# Patient Record
Sex: Male | Born: 1955 | Race: White | Hispanic: No | Smoking: Never smoker
Health system: Southern US, Community
[De-identification: ages and names within clinical notes are randomized; demographics above are authoritative.]

## PROBLEM LIST (undated history)

## (undated) DIAGNOSIS — I10 Essential (primary) hypertension: Secondary | ICD-10-CM

## (undated) DIAGNOSIS — D759 Disease of blood and blood-forming organs, unspecified: Secondary | ICD-10-CM

## (undated) DIAGNOSIS — E119 Type 2 diabetes mellitus without complications: Secondary | ICD-10-CM

---

## 2020-08-31 ENCOUNTER — Encounter (HOSPITAL_BASED_OUTPATIENT_CLINIC_OR_DEPARTMENT_OTHER): Payer: Self-pay

## 2020-08-31 ENCOUNTER — Emergency Department (HOSPITAL_BASED_OUTPATIENT_CLINIC_OR_DEPARTMENT_OTHER)

## 2020-08-31 ENCOUNTER — Other Ambulatory Visit: Payer: Self-pay

## 2020-08-31 ENCOUNTER — Emergency Department (HOSPITAL_BASED_OUTPATIENT_CLINIC_OR_DEPARTMENT_OTHER)
Admission: EM | Admit: 2020-08-31 | Discharge: 2020-08-31 | Disposition: A | Discharge status: Home / Self Care | Attending: Emergency Medicine | Admitting: Emergency Medicine

## 2020-08-31 DIAGNOSIS — R42 Dizziness and giddiness: Secondary | ICD-10-CM | POA: Insufficient documentation

## 2020-08-31 DIAGNOSIS — E119 Type 2 diabetes mellitus without complications: Secondary | ICD-10-CM | POA: Diagnosis not present

## 2020-08-31 DIAGNOSIS — I10 Essential (primary) hypertension: Secondary | ICD-10-CM | POA: Diagnosis not present

## 2020-08-31 DIAGNOSIS — J329 Chronic sinusitis, unspecified: Secondary | ICD-10-CM | POA: Diagnosis not present

## 2020-08-31 HISTORY — DX: Type 2 diabetes mellitus without complications: E11.9

## 2020-08-31 HISTORY — DX: Essential (primary) hypertension: I10

## 2020-08-31 LAB — COMPREHENSIVE METABOLIC PANEL
ALT: 18 U/L (ref 0–44)
AST: 22 U/L (ref 15–41)
Albumin: 4.5 g/dL (ref 3.5–5.0)
Alkaline Phosphatase: 98 U/L (ref 38–126)
Anion gap: 9 (ref 5–15)
BUN: 16 mg/dL (ref 8–23)
CO2: 25 mmol/L (ref 22–32)
Calcium: 10 mg/dL (ref 8.9–10.3)
Chloride: 101 mmol/L (ref 98–111)
Creatinine, Ser: 0.9 mg/dL (ref 0.61–1.24)
GFR, Estimated: >60 mL/min (ref >60)
Glucose, Bld: 131 mg/dL — ABNORMAL HIGH (ref 70–99)
Potassium: 3.7 mmol/L (ref 3.5–5.1)
Sodium: 135 mmol/L (ref 135–145)
Total Bilirubin: 1.5 mg/dL — ABNORMAL HIGH (ref 0.3–1.2)
Total Protein: 6.8 g/dL (ref 6.5–8.1)

## 2020-08-31 LAB — CBC WITH DIFFERENTIAL/PLATELET
Abs Immature Granulocytes: 0.04 10*3/uL (ref 0.00–0.07)
Basophils Absolute: 0.1 10*3/uL (ref 0.0–0.1)
Basophils Relative: 1 %
Eosinophils Absolute: 0.2 10*3/uL (ref 0.0–0.5)
Eosinophils Relative: 2 %
HCT: 53.6 % — ABNORMAL HIGH (ref 39.0–52.0)
Hemoglobin: 18.2 g/dL — ABNORMAL HIGH (ref 13.0–17.0)
Immature Granulocytes: 0 %
Lymphocytes Relative: 4 %
Lymphs Abs: 0.4 10*3/uL — ABNORMAL LOW (ref 0.7–4.0)
MCH: 28.9 pg (ref 26.0–34.0)
MCHC: 34 g/dL (ref 30.0–36.0)
MCV: 85.1 fL (ref 80.0–100.0)
Monocytes Absolute: 0.5 10*3/uL (ref 0.1–1.0)
Monocytes Relative: 4 %
Neutro Abs: 9.1 10*3/uL — ABNORMAL HIGH (ref 1.7–7.7)
Neutrophils Relative %: 89 %
Platelets: 302 10*3/uL (ref 150–400)
RBC: 6.3 MIL/uL — ABNORMAL HIGH (ref 4.22–5.81)
RDW: 14.4 % (ref 11.5–15.5)
WBC: 10.4 10*3/uL (ref 4.0–10.5)
nRBC: 0 % (ref 0.0–0.2)

## 2020-08-31 LAB — TROPONIN I (HIGH SENSITIVITY): Troponin I (High Sensitivity): 4 ng/L (ref <18)

## 2020-08-31 MED ORDER — MECLIZINE HCL 25 MG PO TABS: 25.0000 mg | ORAL_TABLET | Freq: Three times a day (TID) | ORAL | 0 refills | Status: AC | PRN

## 2020-08-31 MED ORDER — AMOXICILLIN-POT CLAVULANATE 875-125 MG PO TABS: 1.0000 | ORAL_TABLET | Freq: Two times a day (BID) | ORAL | 0 refills | Status: AC

## 2020-08-31 NOTE — ED Provider Notes (Signed)
[facility] EMERGENCY DEPT Provider Note   CSN: [id number] Arrival date & time: 08/31/20  1703     History Chief Complaint  Patient presents with  . Dizziness    Phillip Davidson is a 65 y.o. male.  Presents to ER with concern for dizziness.  States that he has recently developed some increased sinus congestion having a lot of sinus pain in his face.  States that he frequently gets sinus infections around this time a year and these often trigger vertigo episodes.  Reports vertigo he has experienced today is similar to prior.  No generalized headache at present, no neck pain or neck stiffness.  Denies speech change, vision change, numbness or weakness.  No difficulty walking.  Dizziness worse with sudden movements, improved with rest. HPI     Past Medical History:  Diagnosis Date  . Diabetes mellitus without complication (HCC)   . Hypertension     There are no problems to display for this patient.    No family history on file.  Social History   Tobacco Use  . Smoking status: Never Smoker  . Smokeless tobacco: Never Used    Home Medications Prior to Admission medications   Medication Sig Start Date End Date Taking? Authorizing Provider  amoxicillin-clavulanate (AUGMENTIN) 875-125 MG tablet Take 1 tablet by mouth every 12 (twelve) hours for 7 days. 08/31/20 09/07/20 Yes Milagros Loll, MD  meclizine (ANTIVERT) 25 MG tablet Take 1 tablet (25 mg total) by mouth 3 (three) times daily as needed for dizziness. 08/31/20  Yes Milagros Loll, MD    Allergies    Ace inhibitors and Lisinopril  Review of Systems   Review of Systems  Constitutional: Negative for chills and fever.  HENT: Positive for sinus pressure and sinus pain. Negative for ear pain and sore throat.   Eyes: Negative for pain and visual disturbance.  Respiratory: Negative for cough and shortness of breath.   Cardiovascular: Negative for chest pain and palpitations.  Gastrointestinal: Negative  for abdominal pain and vomiting.  Genitourinary: Negative for dysuria and hematuria.  Musculoskeletal: Negative for arthralgias and back pain.  Skin: Negative for color change and rash.  Neurological: Positive for dizziness. Negative for seizures and syncope.  All other systems reviewed and are negative.   Physical Exam Updated Vital Signs BP (!) 176/105 (BP Location: Right Arm)   Pulse 67   Temp 98.7 F (37.1 C) (Oral)   Resp 18   SpO2 96%   Physical Exam Vitals and nursing note reviewed.  Constitutional:      Appearance: He is well-developed.  HENT:     Head: Normocephalic and atraumatic.     Comments: Some tenderness to palpation over maxillary sinuses    Right Ear: Tympanic membrane, ear canal and external ear normal.     Left Ear: Tympanic membrane, ear canal and external ear normal.     Nose: Congestion present.  Eyes:     Conjunctiva/sclera: Conjunctivae normal.  Cardiovascular:     Rate and Rhythm: Normal rate and regular rhythm.     Heart sounds: No murmur heard.   Pulmonary:     Effort: Pulmonary effort is normal. No respiratory distress.     Breath sounds: Normal breath sounds.  Abdominal:     Palpations: Abdomen is soft.     Tenderness: There is no abdominal tenderness.  Musculoskeletal:     Cervical back: Neck supple.  Skin:    General: Skin is warm and dry.  Neurological:  Mental Status: He is alert.     Comments: AAOx3 CN 2-12 intact, speech clear visual fields intact 5/5 strength in b/l UE and LE Sensation to light touch intact in b/l UE and LE Normal FNF Normal gait     ED Results / Procedures / Treatments   Labs (all labs ordered are listed, but only abnormal results are displayed) Labs Reviewed  CBC WITH DIFFERENTIAL/PLATELET - Abnormal; Notable for the following components:      Result Value   RBC 6.30 (*)    Hemoglobin 18.2 (*)    HCT 53.6 (*)    Neutro Abs 9.1 (*)    Lymphs Abs 0.4 (*)    All other components within normal  limits  COMPREHENSIVE METABOLIC PANEL - Abnormal; Notable for the following components:   Glucose, Bld 131 (*)    Total Bilirubin 1.5 (*)    All other components within normal limits  TROPONIN I (HIGH SENSITIVITY)    EKG EKG Interpretation  Date/Time:  Sunday Aug 31 2020 18:03:45 EDT Ventricular Rate:  63 PR Interval:  152 QRS Duration: 90 QT Interval:  384 QTC Calculation: 392 R Axis:   72 Text Interpretation: Normal sinus rhythm Normal ECG Confirmed by Nakiya Rallis ([id number]) on 08/31/2020 7:34:33 PM   Radiology CT Head Wo Contrast  Result Date: 08/31/2020 CLINICAL DATA:  Dizziness EXAM: CT HEAD WITHOUT CONTRAST TECHNIQUE: Contiguous axial images were obtained from the base of the skull through the vertex without intravenous contrast. COMPARISON:  None. FINDINGS: Brain: No evidence of acute infarction, hemorrhage, hydrocephalus, extra-axial collection or mass lesion/mass effect. Vascular: No hyperdense vessel or unexpected calcification. Skull: Normal. Negative for fracture or focal lesion. Sinuses/Orbits: No acute finding. Other: None. IMPRESSION: No acute intracranial abnormality noted. Electronically Signed   By: Mark  Lukens M.D.   On: 08/31/2020 19:24    Procedures Procedures   Medications Ordered in ED Medications - No data to display  ED Course  I have reviewed the triage vital signs and the nursing notes.  Pertinent labs & imaging results that were available during my care of the patient were reviewed by me and considered in my medical decision making (see chart for details).    MDM Rules/Calculators/A&P                         64  year old male presents to ER with concern for sinus congestion/pain as well as vertigo.  On exam patient well-appearing in no distress.  CT head negative, basic labs within normal limits.  On exam notably has a neuro neurologic exam that is normal, no difficulty walking, noted to have some tenderness over his maxillary sinuses but  otherwise benign HEENT exam.  Suspect sinusitis or other upper respiratory illness triggering vertigo.  Given positional nature, suspect BPPV.  Given his reassuring exam and patient's report that he has had prior similar episodes, do not feel more advanced imaging needed at present.  Recommend trial of antibiotics and follow-up with primary doctor.  After the discussed management above, the patient was determined to be safe for discharge.  The patient was in agreement with this plan and all questions regarding their care were answered.  ED return precautions were discussed and the patient will return to the ED with any significant worsening of condition.   Final Clinical Impression(s) / ED Diagnoses Final diagnoses:  Sinusitis, unspecified chronicity, unspecified location  Vertigo    Rx / DC Orders ED Discharge Orders  Ordered    amoxicillin-clavulanate (AUGMENTIN) 875-125 MG tablet  Every 12 hours        08/31/20 1940    meclizine (ANTIVERT) 25 MG tablet  3 times daily PRN        08/31/20 1940           Milagros Loll, MD 09/01/20 1652

## 2020-08-31 NOTE — Discharge Instructions (Addendum)
Follow-up with your primary care doctor.  Return to ER if you feel like your dizziness is worsening, you develop numbness, speech changes, vision changes, weakness, difficulty walking or other new concerning symptom.  Take antibiotic as prescribed for your sinus infection.

## 2020-08-31 NOTE — ED Notes (Signed)
This RN presented the AVS utilizing Teachback Method. Patient verbalizes understanding of Discharge Instructions. Opportunity for Questioning and Answers were provided. Patient Discharged from ED ambulatory to home with Significant Other.

## 2020-08-31 NOTE — ED Notes (Signed)
Repeat Troponin not needed for discharge per MD Dykstra.

## 2020-08-31 NOTE — ED Triage Notes (Signed)
He co dizziness (s;inning-type) which began this morning. He was seen at a [facility] urgent care today and they advised him to come to hospital to be checked. He is awake, alert and in no distress.

## 2022-06-18 ENCOUNTER — Encounter (HOSPITAL_BASED_OUTPATIENT_CLINIC_OR_DEPARTMENT_OTHER): Payer: Self-pay | Admitting: Emergency Medicine

## 2022-06-18 ENCOUNTER — Other Ambulatory Visit: Payer: Self-pay

## 2022-06-18 ENCOUNTER — Emergency Department (HOSPITAL_BASED_OUTPATIENT_CLINIC_OR_DEPARTMENT_OTHER)
Admission: EM | Admit: 2022-06-18 | Discharge: 2022-06-18 | Disposition: A | Discharge status: Home / Self Care | Payer: Medicare Other | Attending: Emergency Medicine | Admitting: Emergency Medicine

## 2022-06-18 DIAGNOSIS — E1165 Type 2 diabetes mellitus with hyperglycemia: Secondary | ICD-10-CM | POA: Insufficient documentation

## 2022-06-18 DIAGNOSIS — R739 Hyperglycemia, unspecified: Secondary | ICD-10-CM

## 2022-06-18 HISTORY — DX: Disease of blood and blood-forming organs, unspecified: D75.9

## 2022-06-18 LAB — CBG MONITORING, ED: Glucose-Capillary: 328 mg/dL — ABNORMAL HIGH (ref 70–99)

## 2022-06-18 LAB — CBC WITH DIFFERENTIAL/PLATELET
Abs Immature Granulocytes: 0.03 10*3/uL (ref 0.00–0.07)
Basophils Absolute: 0.1 10*3/uL (ref 0.0–0.1)
Basophils Relative: 1 %
Eosinophils Absolute: 0.2 10*3/uL (ref 0.0–0.5)
Eosinophils Relative: 2 %
HCT: 43.8 % (ref 39.0–52.0)
Hemoglobin: 15.3 g/dL (ref 13.0–17.0)
Immature Granulocytes: 0 %
Lymphocytes Relative: 24 %
Lymphs Abs: 2.1 10*3/uL (ref 0.7–4.0)
MCH: 31.1 pg (ref 26.0–34.0)
MCHC: 34.9 g/dL (ref 30.0–36.0)
MCV: 89 fL (ref 80.0–100.0)
Monocytes Absolute: 0.4 10*3/uL (ref 0.1–1.0)
Monocytes Relative: 5 %
Neutro Abs: 5.8 10*3/uL (ref 1.7–7.7)
Neutrophils Relative %: 68 %
Platelets: 192 10*3/uL (ref 150–400)
RBC: 4.92 MIL/uL (ref 4.22–5.81)
RDW: 13.9 % (ref 11.5–15.5)
WBC: 8.6 10*3/uL (ref 4.0–10.5)
nRBC: 0 % (ref 0.0–0.2)

## 2022-06-18 LAB — I-STAT VENOUS BLOOD GAS, ED
Acid-Base Excess: 0 mmol/L (ref 0.0–2.0)
Bicarbonate: 25.6 mmol/L (ref 20.0–28.0)
Calcium, Ion: 1.34 mmol/L (ref 1.15–1.40)
HCT: 46 % (ref 39.0–52.0)
Hemoglobin: 15.6 g/dL (ref 13.0–17.0)
O2 Saturation: 70 %
Patient temperature: 98.3
Potassium: 3.6 mmol/L (ref 3.5–5.1)
Sodium: 133 mmol/L — ABNORMAL LOW (ref 135–145)
TCO2: 27 mmol/L (ref 22–32)
pCO2, Ven: 43 mmHg — ABNORMAL LOW (ref 44–60)
pH, Ven: 7.382 (ref 7.25–7.43)
pO2, Ven: 37 mmHg (ref 32–45)

## 2022-06-18 LAB — COMPREHENSIVE METABOLIC PANEL
ALT: 21 U/L (ref 0–44)
AST: 20 U/L (ref 15–41)
Albumin: 4.2 g/dL (ref 3.5–5.0)
Alkaline Phosphatase: 104 U/L (ref 38–126)
Anion gap: 9 (ref 5–15)
BUN: 24 mg/dL — ABNORMAL HIGH (ref 8–23)
CO2: 27 mmol/L (ref 22–32)
Calcium: 10.2 mg/dL (ref 8.9–10.3)
Chloride: 95 mmol/L — ABNORMAL LOW (ref 98–111)
Creatinine, Ser: 1.19 mg/dL (ref 0.61–1.24)
GFR, Estimated: >60 mL/min (ref >60)
Glucose, Bld: 325 mg/dL — ABNORMAL HIGH (ref 70–99)
Potassium: 3.6 mmol/L (ref 3.5–5.1)
Sodium: 131 mmol/L — ABNORMAL LOW (ref 135–145)
Total Bilirubin: 1 mg/dL (ref 0.3–1.2)
Total Protein: 7 g/dL (ref 6.5–8.1)

## 2022-06-18 MED ORDER — SODIUM CHLORIDE 0.9 % IV BOLUS
1000.0000 mL | Freq: Once | INTRAVENOUS | Status: AC
  Administered 2022-06-18: 1000 mL via INTRAVENOUS

## 2022-06-18 NOTE — ED Provider Notes (Signed)
[facility] Provider Note   CSN: [id number] Arrival date & time: 06/18/22  1735     History  Chief Complaint  Patient presents with   Hyperglycemia    Phillip Davidson is a 67 y.o. male.  Patient sent for evaluation by primary care doctor as blood sugar was elevated.  Prescription was sent in for metformin but they told him to come to get evaluated.  Denies any chest pain or shortness of breath.  Denies any major urinary frequency or increased thirst or dizziness or weakness.  Denies any chest pain or shortness of breath or fever or chills.  The history is provided by the patient.       Home Medications Prior to Admission medications   Medication Sig Start Date End Date Taking? Authorizing Provider  meclizine (ANTIVERT) 25 MG tablet Take 1 tablet (25 mg total) by mouth 3 (three) times daily as needed for dizziness. 08/31/20   Lucrezia Starch, MD      Allergies    Ace inhibitors and Lisinopril    Review of Systems   Review of Systems  Physical Exam Updated Vital Signs BP (!) 158/91 (BP Location: Right Arm)   Pulse (!) 59   Temp 97.9 F (36.6 C)   Resp 16   SpO2 98%  Physical Exam Vitals and nursing note reviewed.  Constitutional:      General: He is not in acute distress.    Appearance: He is well-developed. He is not ill-appearing.  HENT:     Head: Normocephalic and atraumatic.     Nose: Nose normal.     Mouth/Throat:     Mouth: Mucous membranes are moist.  Eyes:     Extraocular Movements: Extraocular movements intact.     Conjunctiva/sclera: Conjunctivae normal.     Pupils: Pupils are equal, round, and reactive to light.  Cardiovascular:     Rate and Rhythm: Normal rate and regular rhythm.     Pulses: Normal pulses.     Heart sounds: Normal heart sounds. No murmur heard. Pulmonary:     Effort: Pulmonary effort is normal. No respiratory distress.     Breath sounds: Normal breath sounds.  Abdominal:      Palpations: Abdomen is soft.     Tenderness: There is no abdominal tenderness.  Musculoskeletal:        General: No swelling.     Cervical back: Normal range of motion and neck supple.  Skin:    General: Skin is warm and dry.     Capillary Refill: Capillary refill takes less than 2 seconds.  Neurological:     General: No focal deficit present.     Mental Status: He is alert.  Psychiatric:        Mood and Affect: Mood normal.     ED Results / Procedures / Treatments   Labs (all labs ordered are listed, but only abnormal results are displayed) Labs Reviewed  COMPREHENSIVE METABOLIC PANEL - Abnormal; Notable for the following components:      Result Value   Sodium 131 (*)    Chloride 95 (*)    Glucose, Bld 325 (*)    BUN 24 (*)    All other components within normal limits  CBG MONITORING, ED - Abnormal; Notable for the following components:   Glucose-Capillary 328 (*)    All other components within normal limits  I-STAT VENOUS BLOOD GAS, ED - Abnormal; Notable for the following components:   pCO2,  Ven 43.0 (*)    Sodium 133 (*)    All other components within normal limits  CBC WITH DIFFERENTIAL/PLATELET  BLOOD GAS, VENOUS    EKG None  Radiology No results found.  Procedures Procedures    Medications Ordered in ED Medications  sodium chloride 0.9 % bolus 1,000 mL (1,000 mLs Intravenous New Bag/Given 06/18/22 1822)    ED Course/ Medical Decision Making/ A&P                             Medical Decision Making Amount and/or Complexity of Data Reviewed Labs: ordered.   [name] is here with high blood sugar.  Was diagnosed with diabetes and sent for evaluation to make sure he was not in DKA.  He has been started on metformin but has not started it yet.  Primary care doctor sent him.  Blood sugars 328 here.  Very well-appearing.  Normal vitals.  No fever.  Will evaluate for DKA with CBC, CMP, blood gas.  Will give fluid bolus.  pH is normal.  Bicarb is  normal.  Have no concern for DKA.  IV fluid bolus to be given.  Anticipate discharge and have him start metformin.  I educated him about diet and exercise.  Awaiting CBC and CMP.  Remaining lab work per my review and interpretation is unremarkable.  Patient's not in DKA.  IV fluids given.  Recommend starting metformin and close follow-up with primary care doctor.  This chart was dictated using voice recognition software.  Despite best efforts to proofread,  errors can occur which can change the documentation meaning.         Final Clinical Impression(s) / ED Diagnoses Final diagnoses:  Hyperglycemia    Rx / DC Orders ED Discharge Orders     None         Lennice Sites, DO 06/18/22 1856

## 2022-06-18 NOTE — ED Notes (Signed)
Discharge instructions, medications and follow up care reviewed and explained. Pt verbalized understanding and had no further questions.   

## 2022-06-18 NOTE — ED Triage Notes (Signed)
Checked glucose at home. Home reading 377.  Seen by PCP today and started on metformin.  Denies symptoms

## 2023-01-05 IMAGING — CT CT HEAD W/O CM
4 series · 17 of 47 positions shown, 19 images · non-contrast
Comparison: None.

CLINICAL DATA: Dizziness

EXAM:
CT HEAD WITHOUT CONTRAST
TECHNIQUE: Contiguous axial images were obtained from the base of the skull
through the vertex without intravenous contrast.

[Series 2: head wo · axial · 0.45mm/px · z∈[-274,-154]mm · 7 of 32 slices shown, 9 images]
[im 4/32  brain]
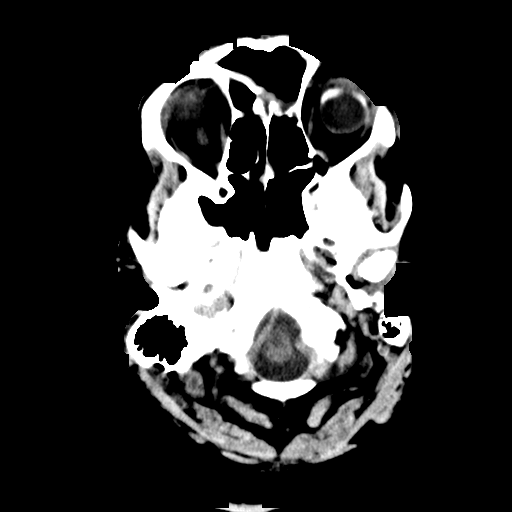
[im 4/32  bone]
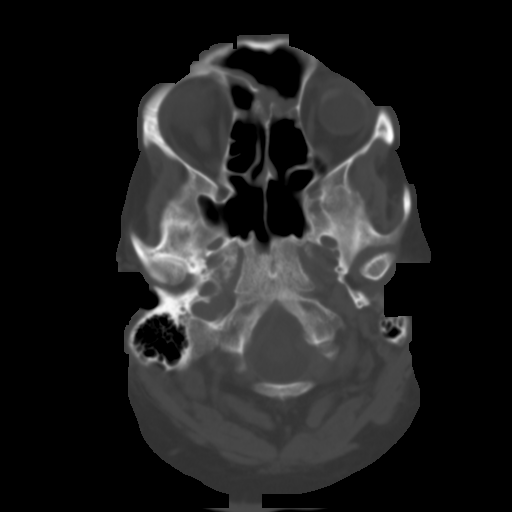
[im 8/32  brain]
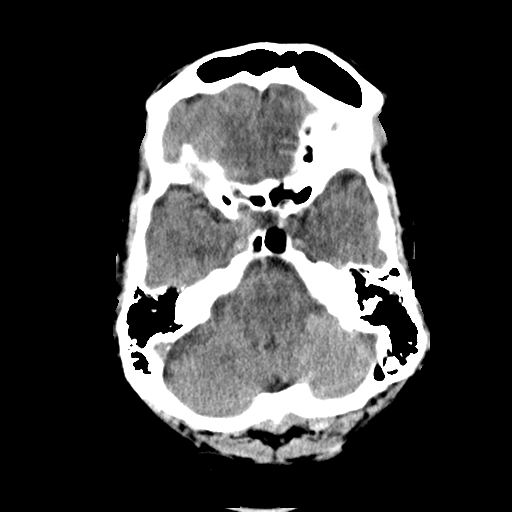
[im 12/32  brain]
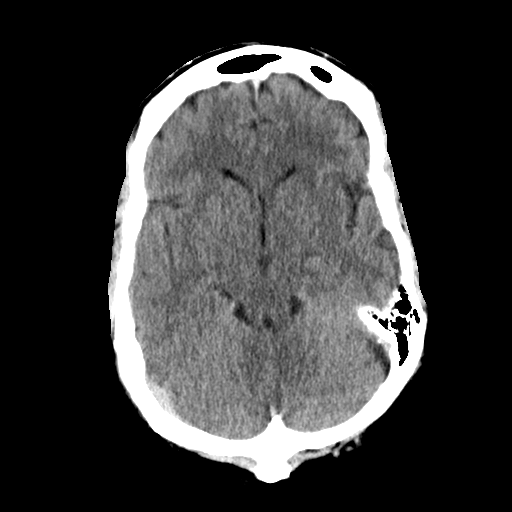
[im 16/32  brain]
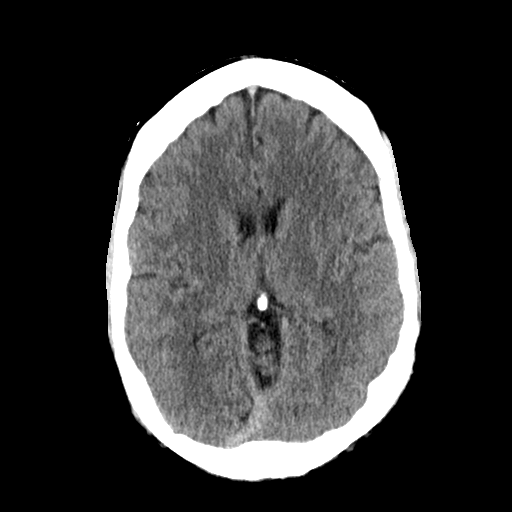
[im 20/32  brain]
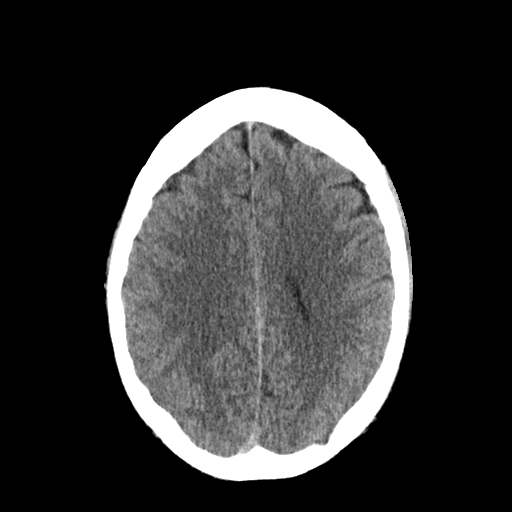
[im 20/32  bone]
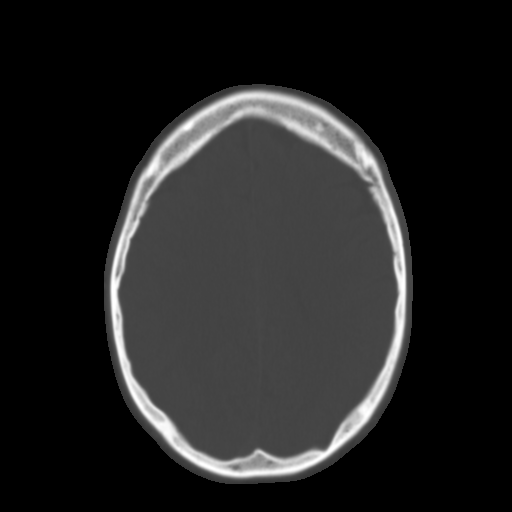
[im 24/32  brain]
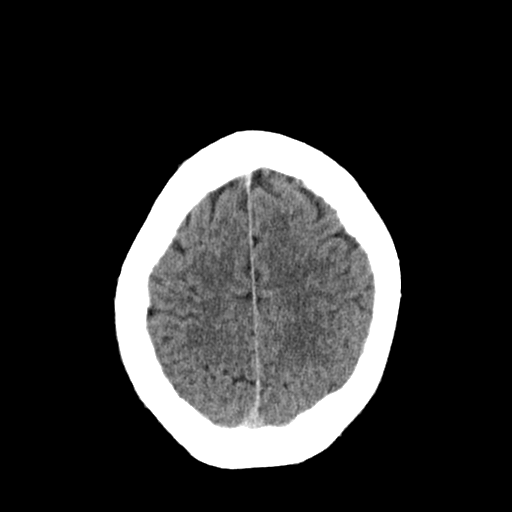
[im 28/32  brain]
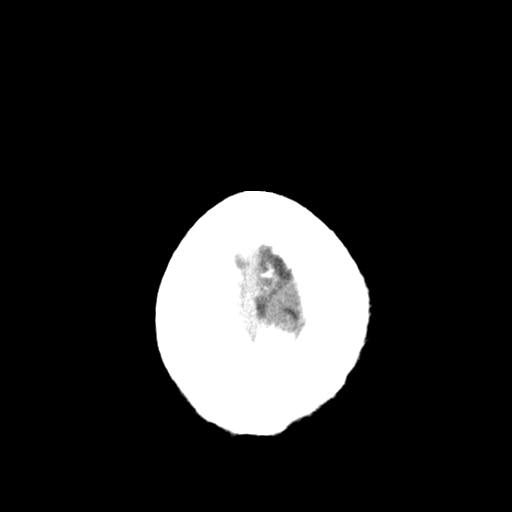

[Series 3: head bone · axial · 0.45mm/px · z∈[-275,-219]mm · 4 of 79 slices shown]
[im 8/79  bone]
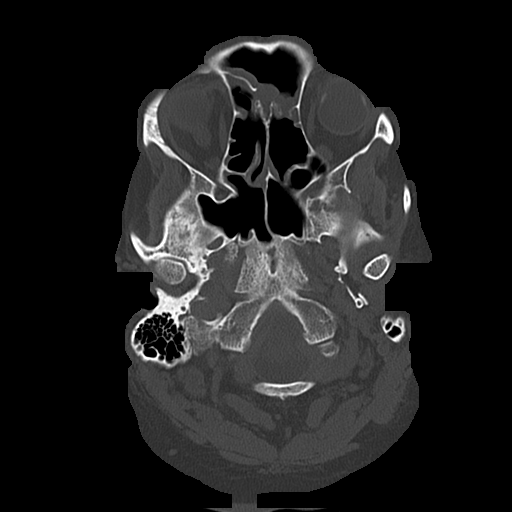
[im 16/79  bone]
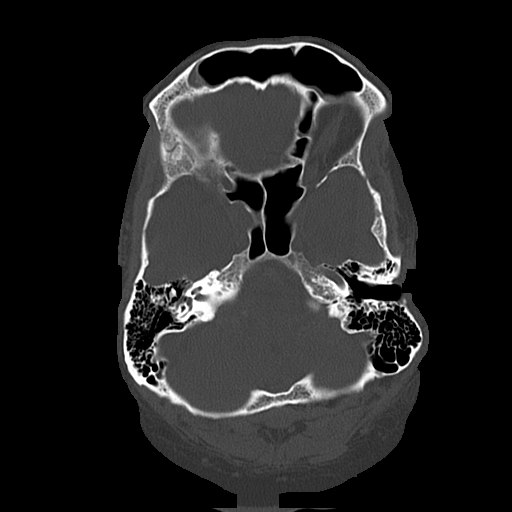
[im 24/79  bone]
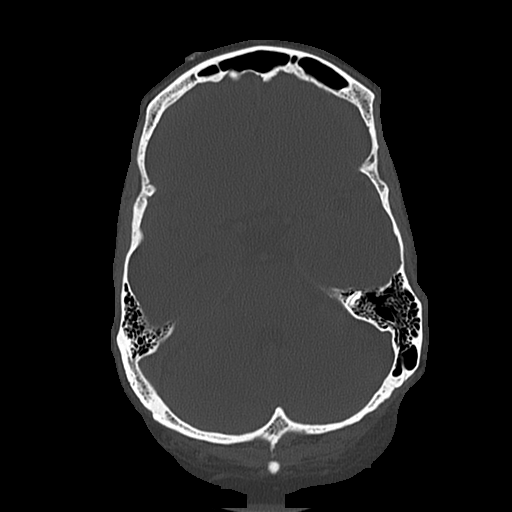
[im 36/79  bone]
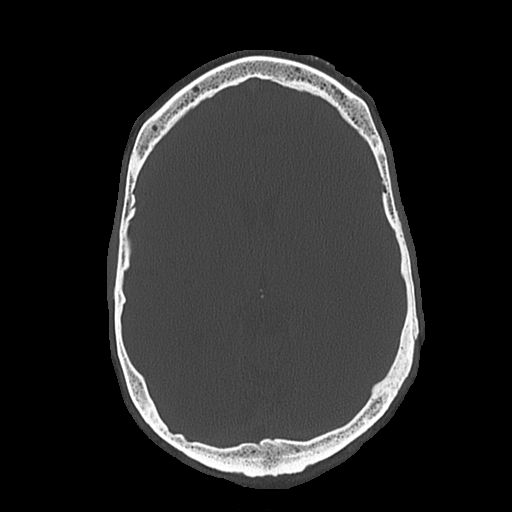

[Series 4: coronal soft · coronal · 0.33mm/px · 3 of 72 slices shown]
[im 24/72  brain]
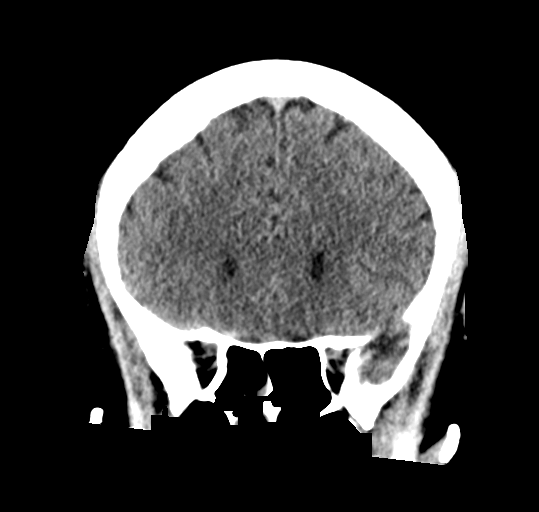
[im 32/72  brain]
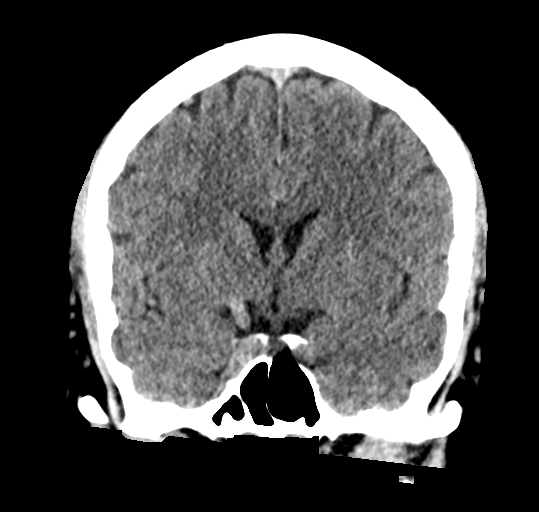
[im 40/72  brain]
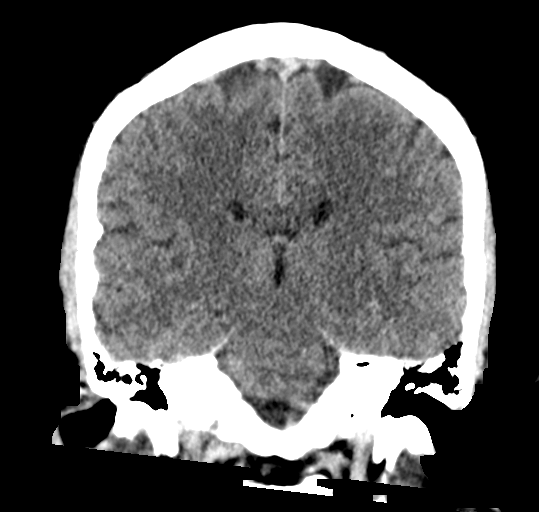

[Series 5: sagittal soft · sagittal · 0.34mm/px · 3 of 54 slices shown]
[im 18/54  brain]
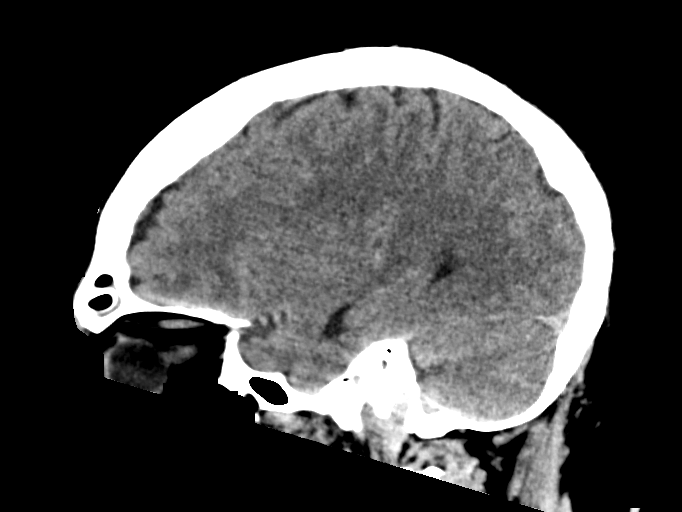
[im 27/54  brain]
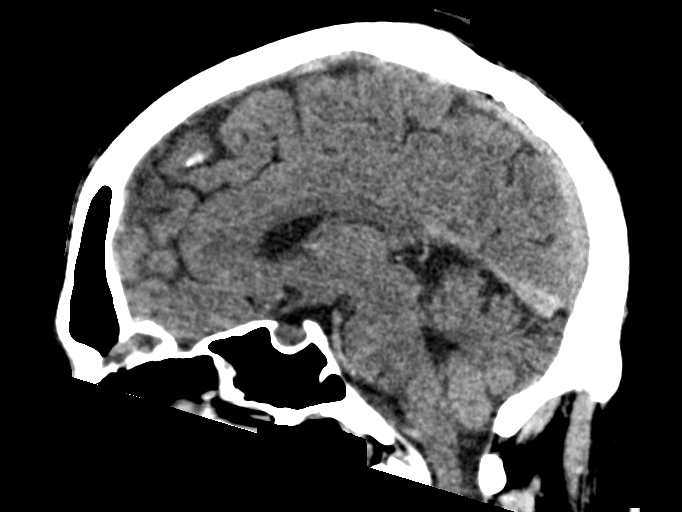
[im 36/54  brain]
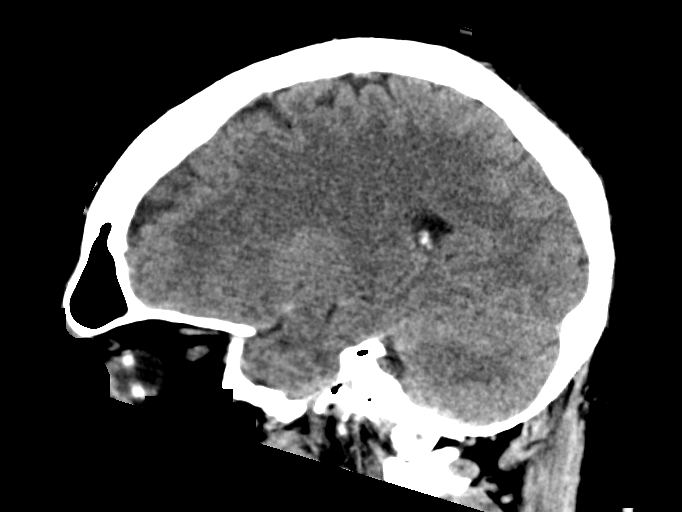

[17 of 47 positions shown; findings below may reference images not displayed]

FINDINGS: Brain: No evidence of acute infarction, hemorrhage, hydrocephalus,
extra-axial collection or mass lesion/mass effect.

Vascular: No hyperdense vessel or unexpected calcification.

Skull: Normal. Negative for fracture or focal lesion.

Sinuses/Orbits: No acute finding.

Other: None.
IMPRESSION: No acute intracranial abnormality noted.
